# Patient Record
Sex: Male | Born: 2005 | Race: White | Hispanic: No | Marital: Single
Health system: Southern US, Community
[De-identification: ages and names within clinical notes are randomized; demographics above are authoritative.]

---
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
## 2024-02-16 DIAGNOSIS — R1909 Other intra-abdominal and pelvic swelling, mass and lump: Secondary | ICD-10-CM | POA: Diagnosis not present

## 2024-03-08 DIAGNOSIS — M7700 Medial epicondylitis, unspecified elbow: Secondary | ICD-10-CM | POA: Diagnosis not present

## 2024-03-08 DIAGNOSIS — G479 Sleep disorder, unspecified: Secondary | ICD-10-CM | POA: Diagnosis not present

## 2024-03-08 DIAGNOSIS — F419 Anxiety disorder, unspecified: Secondary | ICD-10-CM | POA: Diagnosis not present
